# Patient Record
Sex: Male | Born: 1995 | Race: Black or African American | Hispanic: No | Marital: Single | Smoking: Current every day smoker
Health system: Southern US, Community
[De-identification: ages and names within clinical notes are randomized; demographics above are authoritative.]

## PROBLEM LIST (undated history)

## (undated) ENCOUNTER — Emergency Department: Discharge status: Absent without leave | Payer: BC Managed Care – PPO

## (undated) DIAGNOSIS — T7840XA Allergy, unspecified, initial encounter: Secondary | ICD-10-CM

## (undated) DIAGNOSIS — L309 Dermatitis, unspecified: Secondary | ICD-10-CM

## (undated) DIAGNOSIS — N289 Disorder of kidney and ureter, unspecified: Secondary | ICD-10-CM

## (undated) HISTORY — DX: Allergy, unspecified, initial encounter: T78.40XA

## (undated) HISTORY — PX: KIDNEY SURGERY: SHX687

## (undated) HISTORY — DX: Dermatitis, unspecified: L30.9

---

## 2018-02-20 ENCOUNTER — Emergency Department
Admission: EM | Admit: 2018-02-20 | Discharge: 2018-02-20 | Disposition: A | Discharge status: Home / Self Care | Payer: Self-pay | Attending: Emergency Medicine | Admitting: Emergency Medicine

## 2018-02-20 ENCOUNTER — Other Ambulatory Visit: Payer: Self-pay

## 2018-02-20 ENCOUNTER — Emergency Department: Payer: Self-pay

## 2018-02-20 DIAGNOSIS — B354 Tinea corporis: Secondary | ICD-10-CM

## 2018-02-20 DIAGNOSIS — N5089 Other specified disorders of the male genital organs: Secondary | ICD-10-CM

## 2018-02-20 DIAGNOSIS — F17200 Nicotine dependence, unspecified, uncomplicated: Secondary | ICD-10-CM | POA: Insufficient documentation

## 2018-02-20 DIAGNOSIS — N4889 Other specified disorders of penis: Secondary | ICD-10-CM

## 2018-02-20 LAB — CBC WITH DIFFERENTIAL/PLATELET
Basophils Absolute: 0 10*3/uL (ref 0–0.1)
Basophils Relative: 1 %
EOS PCT: 1 %
Eosinophils Absolute: 0.1 10*3/uL (ref 0–0.7)
HCT: 41.4 % (ref 40.0–52.0)
Hemoglobin: 13.8 g/dL (ref 13.0–18.0)
LYMPHS ABS: 2.7 10*3/uL (ref 1.0–3.6)
LYMPHS PCT: 34 %
MCH: 22.4 pg — AB (ref 26.0–34.0)
MCHC: 33.3 g/dL (ref 32.0–36.0)
MCV: 67.3 fL — AB (ref 80.0–100.0)
MONO ABS: 0.7 10*3/uL (ref 0.2–1.0)
Monocytes Relative: 9 %
Neutro Abs: 4.6 10*3/uL (ref 1.4–6.5)
Neutrophils Relative %: 55 %
PLATELETS: 181 10*3/uL (ref 150–440)
RBC: 6.15 MIL/uL — ABNORMAL HIGH (ref 4.40–5.90)
RDW: 15.4 % — AB (ref 11.5–14.5)
WBC: 8.1 10*3/uL (ref 3.8–10.6)

## 2018-02-20 LAB — COMPREHENSIVE METABOLIC PANEL
ALBUMIN: 4.8 g/dL (ref 3.5–5.0)
ALT: 118 U/L — ABNORMAL HIGH (ref 0–44)
AST: 64 U/L — ABNORMAL HIGH (ref 15–41)
Alkaline Phosphatase: 45 U/L (ref 38–126)
Anion gap: 8 (ref 5–15)
BILIRUBIN TOTAL: 0.6 mg/dL (ref 0.3–1.2)
BUN: 14 mg/dL (ref 6–20)
CHLORIDE: 104 mmol/L (ref 98–111)
CO2: 27 mmol/L (ref 22–32)
CREATININE: 1.29 mg/dL — AB (ref 0.61–1.24)
Calcium: 9.8 mg/dL (ref 8.9–10.3)
GFR calc Af Amer: >60 mL/min (ref >60)
GFR calc non Af Amer: >60 mL/min (ref >60)
GLUCOSE: 93 mg/dL (ref 70–99)
POTASSIUM: 4.2 mmol/L (ref 3.5–5.1)
Sodium: 139 mmol/L (ref 135–145)
Total Protein: 8 g/dL (ref 6.5–8.1)

## 2018-02-20 LAB — URINALYSIS, COMPLETE (UACMP) WITH MICROSCOPIC
BACTERIA UA: NONE SEEN
BILIRUBIN URINE: NEGATIVE
Glucose, UA: NEGATIVE mg/dL
HGB URINE DIPSTICK: NEGATIVE
KETONES UR: NEGATIVE mg/dL
LEUKOCYTES UA: NEGATIVE
NITRITE: NEGATIVE
Protein, ur: NEGATIVE mg/dL
Specific Gravity, Urine: 1.018 (ref 1.005–1.030)
Squamous Epithelial / LPF: NONE SEEN (ref 0–5)
pH: 8 (ref 5.0–8.0)

## 2018-02-20 LAB — RAPID HIV SCREEN (HIV 1/2 AB+AG)
HIV 1/2 ANTIBODIES: NONREACTIVE
HIV-1 P24 ANTIGEN - HIV24: NONREACTIVE

## 2018-02-20 MED ORDER — MUPIROCIN CALCIUM 2 % EX CREA: TOPICAL_CREAM | CUTANEOUS | 0 refills | Status: DC

## 2018-02-20 MED ORDER — FLUCONAZOLE 150 MG PO TABS: 150.0000 mg | ORAL_TABLET | ORAL | 0 refills | Status: DC

## 2018-02-20 NOTE — ED Provider Notes (Signed)
[facility] Emergency Department Provider Note  ____________________________________________  Time seen: Approximately 5:12 PM  I have reviewed the triage vital signs and the nursing notes.   HISTORY  Chief Complaint No chief complaint on file.    HPI Duane Simon is a 22 y.o. male who presents the emergency department complaining of swollen area to the left side of his penis as well has a rash to his back.  Patient reports that today when he was urinating he noticed mild erythema and edema to the left side of the penis only.  He denies any pain with finding.  Patient denies any dysuria, polyuria, hematuria.  No penile drainage.  Patient reports that he has had multiple sexual partners and will request STD testing.  Patient also reports that he had sexual relations with a male that was positive for HIV.  Patient is concerned that swelling to the penis may also be related to a spider that he found in his apartment.  He denies any sensation consistent with a bite.    History reviewed. No pertinent past medical history.  There are no active problems to display for this patient.   Past Surgical History:  Procedure Laterality Date  . KIDNEY SURGERY      Prior to Admission medications   Medication Sig Start Date End Date Taking? Authorizing Provider  fluconazole (DIFLUCAN) 150 MG tablet Take 1 tablet (150 mg total) by mouth once a week. 02/20/18   Jemiah Ellenburg, Delorise Royals, PA-C  mupirocin cream (BACTROBAN) 2 % Apply to affected area 3 times daily 02/20/18   Siara Gorder, Delorise Royals, PA-C    Allergies Patient has no known allergies.  History reviewed. No pertinent family history.  Social History Social History   Tobacco Use  . Smoking status: Current Every Day Smoker  Substance Use Topics  . Alcohol use: Yes  . Drug use: Yes    Types: Marijuana     Review of Systems  Constitutional: No fever/chills Eyes: No visual changes.  Cardiovascular: no chest  pain. Respiratory: no cough. No SOB. Gastrointestinal: No abdominal pain.  No nausea, no vomiting.  No diarrhea.  No constipation. Genitourinary: Negative for dysuria. No hematuria.  No penile drainage.  Patient reports erythema to the left side of the penis. Musculoskeletal: Negative for musculoskeletal pain. Skin: Positive for diffuse rash to the posterior back. Neurological: Negative for headaches, focal weakness or numbness. 10-point ROS otherwise negative.  ____________________________________________   PHYSICAL EXAM:  VITAL SIGNS: ED Triage Vitals [02/20/18 1615]  Enc Vitals Group     BP (!) 165/103     Pulse Rate 73     Resp 16     Temp 98.7 F (37.1 C)     Temp Source Oral     SpO2 99 %     Weight 250 lb (113.4 kg)     Height 5\' 8"  (1.727 m)     Head Circumference      Peak Flow      Pain Score 0     Pain Loc      Pain Edu?      Excl. in GC?      Constitutional: Alert and oriented. Well appearing and in no acute distress. Eyes: Conjunctivae are normal. PERRL. EOMI. Head: Atraumatic. ENT:      Ears:       Nose: No congestion/rhinnorhea.      Mouth/Throat: Mucous membranes are moist.  Neck: No stridor.   Hematological/Lymphatic/Immunilogical: No cervical lymphadenopathy.  No inguinal  lymphadenopathy Cardiovascular: Normal rate, regular rhythm. Normal S1 and S2.  Good peripheral circulation. Respiratory: Normal respiratory effort without tachypnea or retractions. Lungs CTAB. Good air entry to the bases with no decreased or absent breath sounds. Gastrointestinal: Bowel sounds 4 quadrants. Soft and nontender to palpation. No guarding or rigidity. No palpable masses. No distention. No CVA tenderness. Genitourinary: Visualization of the penis reveals mild erythema to the lateral aspect, left side of the penile shaft.  Measures less than 1 cm in diameter.  No excoriation.  No chancre.  No vesicle.  Area is nontender to palpation.  Mildly warm to palpation.   Visualization of the testicles, palpation reveals no tenderness or palpable abnormality. Musculoskeletal: Full range of motion to all extremities. No gross deformities appreciated. Neurologic:  Normal speech and language. No gross focal neurologic deficits are appreciated.  Skin:  Skin is warm, dry and intact.  Patient has hyperpigmented macule-like lesions consistent with tinea corporis to the back diffusely.  No erythema or edema concerning for cellulitis.  Rash is not consistent with secondary syphilis. Psychiatric: Mood and affect are normal. Speech and behavior are normal. Patient exhibits appropriate insight and judgement.   ____________________________________________   LABS (all labs ordered are listed, but only abnormal results are displayed)  Labs Reviewed  URINALYSIS, COMPLETE (UACMP) WITH MICROSCOPIC - Abnormal; Notable for the following components:      Result Value   Color, Urine YELLOW (*)    APPearance CLEAR (*)    All other components within normal limits  COMPREHENSIVE METABOLIC PANEL - Abnormal; Notable for the following components:   Creatinine, Ser 1.29 (*)    AST 64 (*)    ALT 118 (*)    All other components within normal limits  CBC WITH DIFFERENTIAL/PLATELET - Abnormal; Notable for the following components:   RBC 6.15 (*)    MCV 67.3 (*)    MCH 22.4 (*)    RDW 15.4 (*)    All other components within normal limits  CHLAMYDIA/NGC RT PCR ([facility] ONLY)  RAPID HIV SCREEN (HIV 1/2 AB+AG)  RPR   ____________________________________________  EKG   ____________________________________________  RADIOLOGY   No results found.  ____________________________________________    PROCEDURES  Procedure(s) performed:    Procedures    Medications - No data to display   ____________________________________________   INITIAL IMPRESSION / ASSESSMENT AND PLAN / ED COURSE  Pertinent labs & imaging results that were available during my care of  the patient were reviewed by me and considered in my medical decision making (see chart for details).  Review of the [place] CSRS was performed in accordance of the [facility] prior to dispensing any controlled drugs.      Patient's diagnosis is consistent with penile swelling, tinea corporis.  Patient presented to the emergency department with complaint of penile swelling.  Visualization reveals mild erythema consistent with either localized irritation or very minimal cellulitis.  Area is nontender to palpation.  No drainage from the penis.  No drainage from the skin lesion.  No chancre or vesicle concerning for syphilis or herpes.  Patient did request STD testing as he has has multiple partners including a partner with HIV.  Rapid HIV was negative.  Syphilis lab is still pending.  No lesions consistent with herpes before skin testing.  Patient will be covered with Bactroban for possible cellulitic changes.  Patient is also given prescription for Diflucan for once weekly for 6 weeks for tinea corporis.  Follow-up with primary care if necessary. Patient is  given ED precautions to return to the ED for any worsening or new symptoms.     ____________________________________________  FINAL CLINICAL IMPRESSION(S) / ED DIAGNOSES  Final diagnoses:  Penile swelling  Tinea corporis      NEW MEDICATIONS STARTED DURING THIS VISIT:  ED Discharge Orders         Ordered    mupirocin cream (BACTROBAN) 2 %     02/20/18 1912    fluconazole (DIFLUCAN) 150 MG tablet  Weekly     02/20/18 1912              This chart was dictated using voice recognition software/Dragon. Despite best efforts to proofread, errors can occur which can change the meaning. Any change was purely unintentional.    Racheal Patches, PA-C 02/20/18 2016    Rockne Menghini, MD 02/21/18 [phone]

## 2018-02-20 NOTE — ED Triage Notes (Signed)
Pt states "I have a couple things of why I'm here but the main thing is my genitals are swollen" states noticed a few hours ago. Thinks he was bitten by bug. States L side is swollen. Denies known injury.   Alert, oriented, ambulatory. No distress noted.

## 2018-02-22 LAB — RPR: RPR: NONREACTIVE

## 2021-07-10 DIAGNOSIS — H52223 Regular astigmatism, bilateral: Secondary | ICD-10-CM | POA: Diagnosis not present

## 2021-08-16 ENCOUNTER — Ambulatory Visit: Payer: BC Managed Care – PPO | Admitting: Nurse Practitioner

## 2021-08-16 ENCOUNTER — Other Ambulatory Visit: Payer: Self-pay

## 2021-08-16 ENCOUNTER — Encounter: Payer: Self-pay | Admitting: Nurse Practitioner

## 2021-08-16 VITALS — BP 124/86 | HR 82 | Temp 98.1°F | Resp 18 | Ht 69.0 in | Wt 246.5 lb

## 2021-08-16 DIAGNOSIS — B354 Tinea corporis: Secondary | ICD-10-CM | POA: Diagnosis not present

## 2021-08-16 DIAGNOSIS — R1011 Right upper quadrant pain: Secondary | ICD-10-CM | POA: Diagnosis not present

## 2021-08-16 DIAGNOSIS — Z130 Encounter for screening for diseases of the blood and blood-forming organs and certain disorders involving the immune mechanism: Secondary | ICD-10-CM

## 2021-08-16 DIAGNOSIS — Z13220 Encounter for screening for lipoid disorders: Secondary | ICD-10-CM | POA: Diagnosis not present

## 2021-08-16 DIAGNOSIS — Z1159 Encounter for screening for other viral diseases: Secondary | ICD-10-CM | POA: Diagnosis not present

## 2021-08-16 DIAGNOSIS — Z23 Encounter for immunization: Secondary | ICD-10-CM

## 2021-08-16 DIAGNOSIS — Z7689 Persons encountering health services in other specified circumstances: Secondary | ICD-10-CM

## 2021-08-16 DIAGNOSIS — R21 Rash and other nonspecific skin eruption: Secondary | ICD-10-CM | POA: Diagnosis not present

## 2021-08-16 DIAGNOSIS — Z131 Encounter for screening for diabetes mellitus: Secondary | ICD-10-CM

## 2021-08-16 MED ORDER — MUPIROCIN CALCIUM 2 % EX CREA: TOPICAL_CREAM | CUTANEOUS | 0 refills | Status: DC

## 2021-08-16 MED ORDER — FLUCONAZOLE 150 MG PO TABS: 150.0000 mg | ORAL_TABLET | ORAL | 0 refills | Status: DC

## 2021-08-16 NOTE — Progress Notes (Signed)
? ?BP 124/86   Pulse 82   Temp 98.1 ?F (36.7 ?C) (Oral)   Resp 18   Ht 5\' 9"  (1.753 m)   Wt 246 lb 8 oz (111.8 kg)   SpO2 97%   BMI 36.40 kg/m?   ? ?Subjective:  ? ? Patient ID: , male    DOB: 01/11/1996, 26 y.o.   MRN: [id number] ? ?HPI: ?Duane Simon is a 26 y.o. male ? ?Chief Complaint  ?Patient presents with  ? Establish Care  ? Rash  ?  Back, neck and arm for 1 year  ? ?Establish care: His last physical was many years ago.   ? ?Rash: He says he has had this rash off and on for a year. He says that he was seen in the emergency department and said that it was a yeast rash. Images taken and placed in media.  He says he was give diflucan and mupirocin cream at the er and that helped.  Refilled prescription and placed referral to dermatology.  ? ?RUQ pain: He says for about a year he has had the pain in his right upper quadrant of his abdomen. He says it comes and goes. He says the pain is a pressure.  Negative murphy sign. He denies any nausea, vomiting, diarrhea, constipation, urinary complaints or fever.  ? ?Relevant past medical, surgical, family and social history reviewed and updated as indicated. Interim medical history since our last visit reviewed. ?Allergies and medications reviewed and updated. ? ?Review of Systems ? ?Constitutional: Negative for fever or weight change.  ?Respiratory: Negative for cough and shortness of breath.   ?Cardiovascular: Negative for chest pain or palpitations.  ?Gastrointestinal: positive for abdominal pain, no bowel changes.  ?Musculoskeletal: Negative for gait problem or joint swelling.  ?Skin: positive for rash.  ?Neurological: Negative for dizziness or headache.  ?No other specific complaints in a complete review of systems (except as listed in HPI above).  ? ?   ?Objective:  ?  ?BP 124/86   Pulse 82   Temp 98.1 ?F (36.7 ?C) (Oral)   Resp 18   Ht 5\' 9"  (1.753 m)   Wt 246 lb 8 oz (111.8 kg)   SpO2 97%   BMI 36.40 kg/m?   ?Wt Readings from Last 3  Encounters:  ?08/16/21 246 lb 8 oz (111.8 kg)  ?02/20/18 250 lb (113.4 kg)  ?  ?Physical Exam ? ?Constitutional: Patient appears well-developed and well-nourished. Obese  No distress.  ?HEENT: head atraumatic, normocephalic, pupils equal and reactive to light,  neck supple ?Cardiovascular: Normal rate, regular rhythm and normal heart sounds.  No murmur heard. No BLE edema. ?Pulmonary/Chest: Effort normal and breath sounds normal. No respiratory distress. ?Abdominal: Soft.  There is no tenderness. ?Skin:dark colored rash noted on chest, arms and back ?Psychiatric: Patient has a normal mood and affect. behavior is normal. Judgment and thought content normal.  ?Results for orders placed or performed in visit on 08/16/21  ?Advanced Written Notification [facility]) Test Refusal  ?Result Value Ref Range  ? RAM1    ? AWN TEST REFUSED 7600   ? ?   ?Assessment & Plan:  ? ?1. Tinea corporis ? ?- Ambulatory referral to Dermatology ?- fluconazole (DIFLUCAN) 150 MG tablet; Take 1 tablet (150 mg total) by mouth once a week.  Dispense: 6 tablet; Refill: 0 ?- mupirocin cream (BACTROBAN) 2 %; Apply to affected area 3 times daily  Dispense: 30 g; Refill: 0 ? ?2. Rash ? ?- Ambulatory referral to  Dermatology ?- fluconazole (DIFLUCAN) 150 MG tablet; Take 1 tablet (150 mg total) by mouth once a week.  Dispense: 6 tablet; Refill: 0 ?- mupirocin cream (BACTROBAN) 2 %; Apply to affected area 3 times daily  Dispense: 30 g; Refill: 0 ? ?3. RUQ abdominal pain ? ?- US ABDOMEN LIMITED RUQ (LIVER/GB); Future ?- CBC with Differential/Platelet ?- COMPLETE METABOLIC PANEL WITH GFR ?- Lipase ?- Amylase ? ?4. Encounter for hepatitis C screening test for low risk patient ? ?- Hepatitis C antibody ? ?5. Screening for cholesterol level ? ?- Lipid panel ? ?6. Screening for diabetes mellitus ? ?- COMPLETE METABOLIC PANEL WITH GFR ? ?7. Screening for deficiency anemia ? ?- CBC with Differential/Platelet ? ?8. Need for Tdap vaccination ? ?- Tdap vaccine greater  than or equal to 7yo IM ? ?9. Encounter to establish care ?-schedule for cpe ? ?Follow up plan: ?Return in about 3 months (around 11/16/2021) for cpe. ? ? ? ? ? ?

## 2021-08-17 LAB — COMPLETE METABOLIC PANEL WITH GFR
AG Ratio: 1.7 (calc) (ref 1.0–2.5)
ALT: 25 U/L (ref 9–46)
AST: 22 U/L (ref 10–40)
Albumin: 4.2 g/dL (ref 3.6–5.1)
Alkaline phosphatase (APISO): 52 U/L (ref 36–130)
BUN/Creatinine Ratio: 10 (calc) (ref 6–22)
BUN: 14 mg/dL (ref 7–25)
CO2: 27 mmol/L (ref 20–32)
Calcium: 9.2 mg/dL (ref 8.6–10.3)
Chloride: 107 mmol/L (ref 98–110)
Creat: 1.35 mg/dL — ABNORMAL HIGH (ref 0.60–1.24)
Globulin: 2.5 g/dL (calc) (ref 1.9–3.7)
Glucose, Bld: 100 mg/dL — ABNORMAL HIGH (ref 65–99)
Potassium: 4.1 mmol/L (ref 3.5–5.3)
Sodium: 140 mmol/L (ref 135–146)
Total Bilirubin: 0.3 mg/dL (ref 0.2–1.2)
Total Protein: 6.7 g/dL (ref 6.1–8.1)
eGFR: 75 mL/min/{1.73_m2} (ref >=60)

## 2021-08-17 LAB — CBC WITH DIFFERENTIAL/PLATELET
Absolute Monocytes: 543 cells/uL (ref 200–950)
Basophils Absolute: 41 cells/uL (ref 0–200)
Basophils Relative: 0.5 %
Eosinophils Absolute: 57 cells/uL (ref 15–500)
Eosinophils Relative: 0.7 %
HCT: 42.2 % (ref 38.5–50.0)
Hemoglobin: 13.4 g/dL (ref 13.2–17.1)
Lymphs Abs: 3240 cells/uL (ref 850–3900)
MCH: 21 pg — ABNORMAL LOW (ref 27.0–33.0)
MCHC: 31.8 g/dL — ABNORMAL LOW (ref 32.0–36.0)
MCV: 66.2 fL — ABNORMAL LOW (ref 80.0–100.0)
MPV: 11.4 fL (ref 7.5–12.5)
Monocytes Relative: 6.7 %
Neutro Abs: 4220 cells/uL (ref 1500–7800)
Neutrophils Relative %: 52.1 %
Platelets: 219 10*3/uL (ref 140–400)
RBC: 6.37 10*6/uL — ABNORMAL HIGH (ref 4.20–5.80)
RDW: 15.8 % — ABNORMAL HIGH (ref 11.0–15.0)
Total Lymphocyte: 40 %
WBC: 8.1 10*3/uL (ref 3.8–10.8)

## 2021-08-17 LAB — HEPATITIS C ANTIBODY
Hepatitis C Ab: NONREACTIVE
SIGNAL TO CUT-OFF: 0.18 (ref <1.00)

## 2021-08-17 LAB — ADVANCED WRITTEN NOTIFICATION (AWN) TEST REFUSAL: AWN TEST REFUSED: 7600

## 2021-08-17 LAB — AMYLASE: Amylase: 62 U/L (ref 21–101)

## 2021-08-17 LAB — LIPASE: Lipase: 52 U/L (ref 7–60)

## 2021-08-22 ENCOUNTER — Other Ambulatory Visit: Payer: Self-pay | Admitting: Nurse Practitioner

## 2021-08-22 DIAGNOSIS — R21 Rash and other nonspecific skin eruption: Secondary | ICD-10-CM

## 2021-08-23 ENCOUNTER — Other Ambulatory Visit: Payer: Self-pay | Admitting: Nurse Practitioner

## 2021-08-23 ENCOUNTER — Telehealth: Payer: Self-pay | Admitting: Nurse Practitioner

## 2021-08-23 DIAGNOSIS — R21 Rash and other nonspecific skin eruption: Secondary | ICD-10-CM

## 2021-08-23 MED ORDER — HYDROCORTISONE 1 % EX CREA: 1.0000 "application " | TOPICAL_CREAM | Freq: Two times a day (BID) | CUTANEOUS | 1 refills | Status: DC

## 2021-08-23 NOTE — Telephone Encounter (Signed)
Requested medication (s) are due for refill today: - ? ?Requested medication (s) are on the active medication list: yes ? ?Last refill:  08/16/21 ? ?Future visit scheduled: yes ? ?Notes to clinic:  pharmacy is requesting ointment ? ? ?Requested Prescriptions  ?Pending Prescriptions Disp Refills  ? mupirocin cream (BACTROBAN) 2 % [Pharmacy Med Name: MUPIROCIN 2% CRM] 30 g 0  ?  Sig: APPLY TO AFFECTED AREA THREE TIMES DAILY  ?  ? Off-Protocol Failed - 08/22/2021  5:53 PM  ?  ?  Failed - Medication not assigned to a protocol, review manually.  ?  ?  Passed - Valid encounter within last 12 months  ?  Recent Outpatient Visits   ? ?      ? 1 week ago Tinea corporis  ? [facility] Berniece Salines, FNP  ? ?  ?  ?Future Appointments   ? ?        ? In 2 months Zane Herald, Rudolpho Sevin, FNP [facility]  ? In 5 months Deirdre Evener, MD [facility]  ? ?  ? ?  ?  ?  ? ? ? ? ?

## 2021-08-23 NOTE — Telephone Encounter (Signed)
Pharmacy asking if there is an alternate medication due to expense. See request.  ?

## 2021-08-23 NOTE — Telephone Encounter (Signed)
Called to inform patient of new script. Mailbox full ?

## 2021-08-23 NOTE — Telephone Encounter (Signed)
Medication Refill - Medication:  ?mupirocin cream (BACTROBAN) 2 %  ? ?Has the patient contacted their pharmacy? Yes.   ?Pharmacy called on behalf of pt stating, the medication is to expensive and requested if PCP could send over an alternate medication ? ?Preferred Pharmacy (with phone number or street name):  ?[id number] - [place] ([place]), [place] - [address]  ?[address] [place] ([place]) [place] [address]  ?Phone:  [phone]  Fax:  [phone] ? ?Has the patient been seen for an appointment in the last year OR does the patient have an upcoming appointment? Yes.   ? ?Agent: Please be advised that RX refills may take up to 3 business days. We ask that you follow-up with your pharmacy. ?

## 2021-08-23 NOTE — Telephone Encounter (Signed)
Please advise 

## 2021-08-24 ENCOUNTER — Ambulatory Visit
Admission: RE | Admit: 2021-08-24 | Discharge: 2021-08-24 | Disposition: A | Discharge status: Home / Self Care | Payer: BC Managed Care – PPO | Source: Ambulatory Visit | Attending: Nurse Practitioner | Admitting: Nurse Practitioner

## 2021-08-24 DIAGNOSIS — R1011 Right upper quadrant pain: Secondary | ICD-10-CM | POA: Insufficient documentation

## 2021-08-25 ENCOUNTER — Other Ambulatory Visit: Payer: Self-pay | Admitting: Nurse Practitioner

## 2021-08-25 DIAGNOSIS — R21 Rash and other nonspecific skin eruption: Secondary | ICD-10-CM

## 2021-08-25 MED ORDER — MUPIROCIN 2 % EX OINT: 1.0000 "application " | TOPICAL_OINTMENT | Freq: Two times a day (BID) | CUTANEOUS | 0 refills | Status: DC

## 2021-10-13 ENCOUNTER — Other Ambulatory Visit: Payer: Self-pay | Admitting: Nurse Practitioner

## 2021-10-13 DIAGNOSIS — R21 Rash and other nonspecific skin eruption: Secondary | ICD-10-CM

## 2021-10-17 ENCOUNTER — Other Ambulatory Visit: Payer: Self-pay

## 2021-10-17 NOTE — Telephone Encounter (Signed)
Requested medications are due for refill today.  unsure  Requested medications are on the active medications list.  yes  Last refill. 08/16/2021 #6 0 refills  Future visit scheduled.   yes  Notes to clinic.  Medication is not assigned to a protocol - please review.    Requested Prescriptions  Pending Prescriptions Disp Refills   fluconazole (DIFLUCAN) 150 MG tablet [Pharmacy Med Name: Fluconazole 150 MG Oral Tablet] 6 tablet 0    Sig: Take 1 tablet by mouth once a week     Off-Protocol Failed - 10/13/2021  5:31 AM      Failed - Medication not assigned to a protocol, review manually.      Passed - Valid encounter within last 12 months    Recent Outpatient Visits           2 months ago Tinea corporis   [facility] Berniece Salines, FNP       Future Appointments             In 1 month Zane Herald, Rudolpho Sevin, FNP [facility]   In 3 months Deirdre Evener, MD [facility]

## 2021-11-16 ENCOUNTER — Ambulatory Visit: Payer: BC Managed Care – PPO | Admitting: Nurse Practitioner

## 2021-11-16 NOTE — Progress Notes (Deleted)
Name: Duane Simon   MRN: [id number]    DOB: 1995-08-28   Date:11/16/2021       Progress Note  Subjective  Chief Complaint  No chief complaint on file.   HPI  Patient presents for annual CPE .   Diet: *** Exercise: *** Sleep: ***  Depression: phq 9 is {gen pos [id number]}    08/16/2021   10:53 AM  Depression screen PHQ 2/9  Decreased Interest 0  Down, Depressed, Hopeless 0  PHQ - 2 Score 0  Altered sleeping 0  Tired, decreased energy 0  Change in appetite 0  Feeling bad or failure about yourself  0  Trouble concentrating 0  Moving slowly or fidgety/restless 0  Suicidal thoughts 0  PHQ-9 Score 0    Hypertension:  BP Readings from Last 3 Encounters:  08/16/21 124/86  02/20/18 139/90    Obesity: Wt Readings from Last 3 Encounters:  08/16/21 246 lb 8 oz (111.8 kg)  02/20/18 250 lb (113.4 kg)   BMI Readings from Last 3 Encounters:  08/16/21 36.40 kg/m  02/20/18 38.01 kg/m     Lipids:  No results found for: "CHOL" No results found for: "HDL" No results found for: "LDLCALC" No results found for: "TRIG" No results found for: "CHOLHDL" No results found for: "LDLDIRECT" Glucose:  Glucose, Bld  Date Value Ref Range Status  08/16/2021 100 (H) 65 - 99 mg/dL Final    Comment:    .            Fasting reference interval . For someone without known diabetes, a glucose value between 100 and 125 mg/dL is consistent with prediabetes and should be confirmed with a follow-up test. .   02/20/2018 93 70 - 99 mg/dL Final    Flowsheet Row Office Visit from 08/16/2021 in [facility]  AUDIT-C Score 0        Single STD testing and prevention (HIV/chl/gon/syphilis): 02/20/2018 Hep C: 08/16/2021  Skin cancer: Discussed monitoring for atypical lesions Colorectal cancer: no concerns, does not qualify Prostate cancer: no concerns, does not qualify No results found for: "PSA"   Lung cancer:   Low Dose CT Chest recommended if Age 28-80 years,  30 pack-year currently smoking OR have quit w/in 15years. Patient does not qualify.   AAA:  The USPSTF recommends one-time screening with ultrasonography in men ages 96 to 33 years who have ever smoked ECG:  none  Vaccines:  HPV: up to at age 80 , ask insurance if age between 16-45  Shingrix: 86-64 yo and ask insurance if covered when patient above 10 yo Pneumonia:  educated and discussed with patient. Flu:  educated and discussed with patient.  Advanced Care Planning: A voluntary discussion about advance care planning including the explanation and discussion of advance directives.  Discussed health care proxy and Living will, and the patient was able to identify a health care proxy as ***.  Patient {DOES_DOES [id number]} have a living will at present time. If patient does have living will, I have requested they bring this to the clinic to be scanned in to their chart.  There are no problems to display for this patient.   Past Surgical History:  Procedure Laterality Date   KIDNEY SURGERY     KIDNEY SURGERY     rupture lidney at 26 years old    No family history on file.  Social History   Socioeconomic History   Marital status: Single    Spouse name: Not on file  Number of children: 1   Years of education: Not on file   Highest education level: Not on file  Occupational History   Not on file  Tobacco Use   Smoking status: Every Day   Smokeless tobacco: Not on file  Vaping Use   Vaping Use: Never used  Substance and Sexual Activity   Alcohol use: Yes   Drug use: Yes    Types: Marijuana   Sexual activity: Not on file  Other Topics Concern   Not on file  Social History Narrative   Not on file   Social Determinants of Health   Financial Resource Strain: Not on file  Food Insecurity: Not on file  Transportation Needs: Not on file  Physical Activity: Not on file  Stress: Not on file  Social Connections: Not on file  Intimate Partner Violence: Not on file      Current Outpatient Medications:    fluconazole (DIFLUCAN) 150 MG tablet, Take 1 tablet (150 mg total) by mouth once a week., Disp: 6 tablet, Rfl: 0   hydrocortisone cream 1 %, Apply 1 application. topically 2 (two) times daily. Do not apply to face, Disp: 15 g, Rfl: 1   mupirocin ointment (BACTROBAN) 2 %, Apply 1 application. topically 2 (two) times daily., Disp: 22 g, Rfl: 0  No Known Allergies   ROS  ***   Objective  There were no vitals filed for this visit.  There is no height or weight on file to calculate BMI.  Physical Exam ***  No results found for this or any previous visit (from the past 2160 hour(s)).   Fall Risk:    08/16/2021   10:52 AM  Fall Risk   Falls in the past year? 0  Number falls in past yr: 0  Injury with Fall? 0  Follow up Falls evaluation completed   ***   Functional Status Survey:   ***   Assessment & Plan  There are no diagnoses linked to this encounter.   -Prostate cancer screening and PSA options (with potential risks and benefits of testing vs not testing) were discussed along with recent recs/guidelines. -USPSTF grade A and B recommendations reviewed with patient; age-appropriate recommendations, preventive care, screening tests, etc discussed and encouraged; healthy living encouraged; see AVS for patient education given to patient -Discussed importance of 150 minutes of physical activity weekly, eat two servings of fish weekly, eat one serving of tree nuts ( cashews, pistachios, pecans, almonds.Marland Kitchen) every other day, eat 6 servings of fruit/vegetables daily and drink plenty of water and avoid sweet beverages.

## 2022-02-08 ENCOUNTER — Ambulatory Visit: Payer: Self-pay | Admitting: Dermatology

## 2022-03-15 DIAGNOSIS — G510 Bell's palsy: Secondary | ICD-10-CM | POA: Diagnosis not present

## 2022-03-16 ENCOUNTER — Ambulatory Visit (INDEPENDENT_AMBULATORY_CARE_PROVIDER_SITE_OTHER): Payer: BC Managed Care – PPO | Admitting: Nurse Practitioner

## 2022-03-16 ENCOUNTER — Encounter: Payer: Self-pay | Admitting: Nurse Practitioner

## 2022-03-16 VITALS — BP 138/72 | HR 93 | Temp 98.7°F | Resp 16 | Ht 69.0 in | Wt 246.1 lb

## 2022-03-16 DIAGNOSIS — G510 Bell's palsy: Secondary | ICD-10-CM | POA: Diagnosis not present

## 2022-03-16 MED ORDER — VALACYCLOVIR HCL 1 G PO TABS: 1000.0000 mg | ORAL_TABLET | Freq: Three times a day (TID) | ORAL | 0 refills | Status: AC

## 2022-03-16 NOTE — Progress Notes (Signed)
BP 138/72 (BP Location: Left Arm, Patient Position: Sitting)   Pulse 93   Temp 98.7 F (37.1 C)   Resp 16   Ht _0  (1.753 m)   Wt 246 lb 1.6 oz (111.6 kg)   SpO2 98%   BMI 36.34 kg/m    Subjective:    Patient ID: Duane Simon, male    DOB: [birth year], 26 y.o.   MRN: [id number]  HPI: Duane Simon is a 26 y.o. male  Chief Complaint  Patient presents with   Pain    Right side facial pain   Bells palsy: Symptoms started yesterday he says he had a headache yesterday and then noticed that his right side facial droop, eye would not close and he could not hold his cigarette in his mouth.  He went to [facility] urgent care was told he had bells palsy and was given a prescription for Prednisone 20 mg tablets, taking 60 mg a day for 7 days.  Patient states he has not started the prednisone yet. Patient reports he is having right sided facial pain, numbness and facial droop.  Other than the above neuro exam was normal.  Will prescribe Valtrex 1000 mg 3 times a day for 7 days.  Discussed with patient about picking up some artificial tears to keep his right eye hydrated.  Discussed with patient taping eye shut when going to sleep to help protect his cornea.  Recommend patient take Tylenol or ibuprofen for the pain.  Patient can also use warm compresses on face to help with pain relief.  Relevant past medical, surgical, family and social history reviewed and updated as indicated. Interim medical history since our last visit reviewed. Allergies and medications reviewed and updated.  Review of Systems  Constitutional: Negative for fever or weight change.  HEENT: Positive for right-sided facial droop, and facial pain Respiratory: Negative for cough and shortness of breath.   Cardiovascular: Negative for chest pain or palpitations.  Gastrointestinal: Negative for abdominal pain, no bowel changes.  Musculoskeletal: Negative for gait problem or joint swelling.  Skin: Negative for rash.  Neurological:  Negative for dizziness or headache.  No other specific complaints in a complete review of systems (except as listed in HPI above).      Objective:    BP 138/72 (BP Location: Left Arm, Patient Position: Sitting)   Pulse 93   Temp 98.7 F (37.1 C)   Resp 16   Ht _1  (1.753 m)   Wt 246 lb 1.6 oz (111.6 kg)   SpO2 98%   BMI 36.34 kg/m   Wt Readings from Last 3 Encounters:  03/16/22 246 lb 1.6 oz (111.6 kg)  08/16/21 246 lb 8 oz (111.8 kg)  02/20/18 250 lb (113.4 kg)    Physical Exam  Constitutional: Patient appears well-developed and well-nourished. Obese  No distress.  HEENT: head atraumatic, normocephalic, pupils equal and reactive to light,  neck supple, throat within normal limits Cardiovascular: Normal rate, regular rhythm and normal heart sounds.  No murmur heard. No BLE edema. Pulmonary/Chest: Effort normal and breath sounds normal. No respiratory distress. Abdominal: Soft.  There is no tenderness. NEURO : Equal grips, no arm drift, no loss of sensation in upper or lower extremities, normal gait, right-sided facial droop Psychiatric: Patient has a normal mood and affect. behavior is normal. Judgment and thought content normal.  Results for orders placed or performed in visit on 08/16/21  CBC with Differential/Platelet  Result Value Ref Range   WBC 8.1 3.8 -  10.8 Thousand/uL   RBC 6.37 (H) 4.20 - 5.80 Million/uL   Hemoglobin 13.4 13.2 - 17.1 g/dL   HCT 42.2 38.5 - 50.0 %   MCV 66.2 (L) 80.0 - 100.0 fL   MCH 21.0 (L) 27.0 - 33.0 pg   MCHC 31.8 (L) 32.0 - 36.0 g/dL   RDW 15.8 (H) 11.0 - 15.0 %   Platelets 219 140 - 400 Thousand/uL   MPV 11.4 7.5 - 12.5 fL   Neutro Abs 4,220 1,500 - 7,800 cells/uL   Lymphs Abs 3,240 850 - 3,900 cells/uL   Absolute Monocytes 543 200 - 950 cells/uL   Eosinophils Absolute 57 15 - 500 cells/uL   Basophils Absolute 41 0 - 200 cells/uL   Neutrophils Relative % 52.1 %   Total Lymphocyte 40.0 %   Monocytes Relative 6.7 %   Eosinophils  Relative 0.7 %   Basophils Relative 0.5 %  COMPLETE METABOLIC PANEL WITH GFR  Result Value Ref Range   Glucose, Bld 100 (H) 65 - 99 mg/dL   BUN 14 7 - 25 mg/dL   Creat 1.35 (H) 0.60 - 1.24 mg/dL   eGFR 75 > OR = 60 mL/min/1.78m   BUN/Creatinine Ratio 10 6 - 22 (calc)   Sodium 140 135 - 146 mmol/L   Potassium 4.1 3.5 - 5.3 mmol/L   Chloride 107 98 - 110 mmol/L   CO2 27 20 - 32 mmol/L   Calcium 9.2 8.6 - 10.3 mg/dL   Total Protein 6.7 6.1 - 8.1 g/dL   Albumin 4.2 3.6 - 5.1 g/dL   Globulin 2.5 1.9 - 3.7 g/dL (calc)   AG Ratio 1.7 1.0 - 2.5 (calc)   Total Bilirubin 0.3 0.2 - 1.2 mg/dL   Alkaline phosphatase (APISO) 52 36 - 130 U/L   AST 22 10 - 40 U/L   ALT 25 9 - 46 U/L  Lipase  Result Value Ref Range   Lipase 52 7 - 60 U/L  Hepatitis C antibody  Result Value Ref Range   Hepatitis C Ab NON-REACTIVE NON-REACTIVE   SIGNAL TO CUT-OFF 0.18 <1.00  Amylase  Result Value Ref Range   Amylase 62 21 - 101 U/L  Advanced Written Notification ([facility] Test Refusal  Result Value Ref Range   RAM1     AWN TEST REFUSED 7600       Assessment & Plan:   Problem List Items Addressed This Visit   None Visit Diagnoses     Overfelt's palsy    -  Primary   Start taking Valtrex, prednisone use artificial tears, tape eye closed when going to sleep can use heat therapy for pain and can take Tylenol or ibuprofen.   Relevant Medications   valACYclovir (VALTREX) 1000 MG tablet        Follow up plan: Return if symptoms worsen or fail to improve.

## 2022-03-29 ENCOUNTER — Telehealth: Payer: Self-pay | Admitting: Nurse Practitioner

## 2022-03-29 NOTE — Telephone Encounter (Signed)
Called patient, mailbox full. I have not received any paperwork they will have to fax again

## 2022-03-29 NOTE — Telephone Encounter (Signed)
Copied from CRM [id number]. Topic: General - Inquiry >> Mar 29, 2022  8:48 AM Clide Dales wrote: Patient states that [name] faxed over paperwork for disability on 03/19/22 and they have not received them back. Patient is wanting an update.

## 2022-04-02 ENCOUNTER — Other Ambulatory Visit: Payer: Self-pay | Admitting: Nurse Practitioner

## 2022-04-02 DIAGNOSIS — G510 Bell's palsy: Secondary | ICD-10-CM

## 2022-04-02 NOTE — Telephone Encounter (Signed)
Pt calling back to follow up stated he spoke with [employer], and forms were re-sent today, and he has physical forms; if they haven't been received, he can drop them off.  Please advise.

## 2022-04-02 NOTE — Telephone Encounter (Signed)
Paperwork located in the folder and given to [employer]

## 2022-04-02 NOTE — Telephone Encounter (Signed)
Do you see this paperwork. Not in our folder

## 2022-04-03 NOTE — Telephone Encounter (Signed)
Paperwork faxed, patient notified.

## 2022-04-23 DIAGNOSIS — G510 Bell's palsy: Secondary | ICD-10-CM | POA: Diagnosis not present

## 2022-04-23 DIAGNOSIS — G43009 Migraine without aura, not intractable, without status migrainosus: Secondary | ICD-10-CM | POA: Diagnosis not present

## 2022-04-23 DIAGNOSIS — Z7689 Persons encountering health services in other specified circumstances: Secondary | ICD-10-CM | POA: Diagnosis not present

## 2022-04-27 NOTE — Telephone Encounter (Signed)
Pt returning missed call from [place]. Stated a message was left in regards to a claim with [employer].  Please advise.

## 2022-04-30 NOTE — Telephone Encounter (Signed)
Spoke to patient and informed him the FMLA paperwork should be filled out by his Neurology since this is who he is seeing now. I informed him they are asking for functional capacity and we do not do them here in office. Patient stated he was suppose to return to work on 12/16 and was trying to extend til 06/05/22. He was informed to take up with Neurology. Paperwork will be faxed to Neuro

## 2022-05-02 ENCOUNTER — Telehealth: Payer: Self-pay | Admitting: Nurse Practitioner

## 2022-05-02 NOTE — Telephone Encounter (Signed)
Copied from CRM [id number]. Topic: General - Other >> May 02, 2022  3:21 PM Clide Dales wrote: Patient states that [employer] is needing more forms filled out to extend his FMLA. Please follow up with patient.

## 2022-05-03 NOTE — Telephone Encounter (Signed)
Left message for patient notifying him that the paperwork was faxed to his Neurologist. That office will be who will have to extend his time off and fill out FMLA

## 2022-05-22 DIAGNOSIS — G43009 Migraine without aura, not intractable, without status migrainosus: Secondary | ICD-10-CM | POA: Diagnosis not present

## 2022-05-22 DIAGNOSIS — G510 Bell's palsy: Secondary | ICD-10-CM | POA: Diagnosis not present

## 2023-03-26 NOTE — Progress Notes (Unsigned)
There were no vitals taken for this visit.   Subjective:    Patient ID: Duane Simon, male    DOB: 04-21-1996, 27 y.o.   MRN: [id number]  HPI: Duane Simon is a 27 y.o. male  No chief complaint on file.   Relevant past medical, surgical, family and social history reviewed and updated as indicated. Interim medical history since our last visit reviewed. Allergies and medications reviewed and updated.  Review of Systems  Ten systems reviewed and is negative except as mentioned in HPI ***      Objective:    There were no vitals taken for this visit.  Wt Readings from Last 3 Encounters:  03/16/22 246 lb 1.6 oz (111.6 kg)  08/16/21 246 lb 8 oz (111.8 kg)  02/20/18 250 lb (113.4 kg)    Physical Exam  Constitutional: Patient appears well-developed and well-nourished. Obese *** No distress.  HEENT: head atraumatic, normocephalic, pupils equal and reactive to light, ears ***, neck supple, throat within normal limits Cardiovascular: Normal rate, regular rhythm and normal heart sounds.  No murmur heard. No BLE edema. Pulmonary/Chest: Effort normal and breath sounds normal. No respiratory distress. Abdominal: Soft.  There is no tenderness. Psychiatric: Patient has a normal mood and affect. behavior is normal. Judgment and thought content normal.  Results for orders placed or performed in visit on 08/16/21  CBC with Differential/Platelet  Result Value Ref Range   WBC 8.1 3.8 - 10.8 Thousand/uL   RBC 6.37 (H) 4.20 - 5.80 Million/uL   Hemoglobin 13.4 13.2 - 17.1 g/dL   HCT 52.8 41.3 - 24.4 %   MCV 66.2 (L) 80.0 - 100.0 fL   MCH 21.0 (L) 27.0 - 33.0 pg   MCHC 31.8 (L) 32.0 - 36.0 g/dL   RDW 01.0 (H) 27.2 - 53.6 %   Platelets 219 140 - 400 Thousand/uL   MPV 11.4 7.5 - 12.5 fL   Neutro Abs 4,220 1,500 - 7,800 cells/uL   Lymphs Abs 3,240 850 - 3,900 cells/uL   Absolute Monocytes 543 200 - 950 cells/uL   Eosinophils Absolute 57 15 - 500 cells/uL   Basophils Absolute 41 0 - 200  cells/uL   Neutrophils Relative % 52.1 %   Total Lymphocyte 40.0 %   Monocytes Relative 6.7 %   Eosinophils Relative 0.7 %   Basophils Relative 0.5 %  COMPLETE METABOLIC PANEL WITH GFR  Result Value Ref Range   Glucose, Bld 100 (H) 65 - 99 mg/dL   BUN 14 7 - 25 mg/dL   Creat 6.44 (H) 0.34 - 1.24 mg/dL   eGFR 75 > OR = 60 VQ/[id number]   BUN/Creatinine Ratio 10 6 - 22 (calc)   Sodium 140 135 - 146 mmol/L   Potassium 4.1 3.5 - 5.3 mmol/L   Chloride 107 98 - 110 mmol/L   CO2 27 20 - 32 mmol/L   Calcium 9.2 8.6 - 10.3 mg/dL   Total Protein 6.7 6.1 - 8.1 g/dL   Albumin 4.2 3.6 - 5.1 g/dL   Globulin 2.5 1.9 - 3.7 g/dL (calc)   AG Ratio 1.7 1.0 - 2.5 (calc)   Total Bilirubin 0.3 0.2 - 1.2 mg/dL   Alkaline phosphatase (APISO) 52 36 - 130 U/L   AST 22 10 - 40 U/L   ALT 25 9 - 46 U/L  Lipase  Result Value Ref Range   Lipase 52 7 - 60 U/L  Hepatitis C antibody  Result Value Ref Range   Hepatitis C Ab NON-REACTIVE  NON-REACTIVE   SIGNAL TO CUT-OFF 0.18 <1.00  Amylase  Result Value Ref Range   Amylase 62 21 - 101 U/L  Advanced Written Notification [facility]) Test Refusal  Result Value Ref Range   RAM1     AWN TEST REFUSED 7600       Assessment & Plan:   Problem List Items Addressed This Visit   None    Follow up plan: No follow-ups on file.

## 2023-03-27 ENCOUNTER — Encounter: Payer: Self-pay | Admitting: Nurse Practitioner

## 2023-03-27 ENCOUNTER — Ambulatory Visit: Payer: BC Managed Care – PPO | Admitting: Nurse Practitioner

## 2023-03-27 ENCOUNTER — Ambulatory Visit
Admission: RE | Admit: 2023-03-27 | Discharge: 2023-03-27 | Disposition: A | Discharge status: Home / Self Care | Payer: BC Managed Care – PPO | Source: Ambulatory Visit | Attending: Nurse Practitioner | Admitting: Nurse Practitioner

## 2023-03-27 ENCOUNTER — Ambulatory Visit
Admission: RE | Admit: 2023-03-27 | Discharge: 2023-03-27 | Disposition: A | Discharge status: Home / Self Care | Payer: BC Managed Care – PPO | Attending: Nurse Practitioner | Admitting: Nurse Practitioner

## 2023-03-27 ENCOUNTER — Other Ambulatory Visit: Payer: Self-pay

## 2023-03-27 VITALS — BP 122/80 | HR 99 | Temp 98.8°F | Resp 18 | Ht 69.0 in | Wt 244.1 lb

## 2023-03-27 DIAGNOSIS — M5441 Lumbago with sciatica, right side: Secondary | ICD-10-CM | POA: Insufficient documentation

## 2023-03-27 DIAGNOSIS — J40 Bronchitis, not specified as acute or chronic: Secondary | ICD-10-CM

## 2023-03-27 DIAGNOSIS — G8929 Other chronic pain: Secondary | ICD-10-CM

## 2023-03-27 DIAGNOSIS — M545 Low back pain, unspecified: Secondary | ICD-10-CM | POA: Diagnosis not present

## 2023-03-27 DIAGNOSIS — R051 Acute cough: Secondary | ICD-10-CM | POA: Diagnosis not present

## 2023-03-27 MED ORDER — METHOCARBAMOL 500 MG PO TABS: 500.0000 mg | ORAL_TABLET | Freq: Two times a day (BID) | ORAL | 0 refills | Status: DC | PRN

## 2023-03-27 MED ORDER — AZITHROMYCIN 250 MG PO TABS: ORAL_TABLET | ORAL | 0 refills | Status: AC

## 2023-03-27 MED ORDER — NAPROXEN 500 MG PO TABS: 500.0000 mg | ORAL_TABLET | Freq: Two times a day (BID) | ORAL | 0 refills | Status: DC

## 2023-03-27 MED ORDER — ALBUTEROL SULFATE HFA 108 (90 BASE) MCG/ACT IN AERS: 2.0000 | INHALATION_SPRAY | Freq: Four times a day (QID) | RESPIRATORY_TRACT | 0 refills | Status: AC | PRN

## 2023-03-27 MED ORDER — METHYLPREDNISOLONE 4 MG PO TBPK: ORAL_TABLET | ORAL | 0 refills | Status: AC

## 2023-03-29 ENCOUNTER — Encounter: Payer: Self-pay | Admitting: Nurse Practitioner

## 2023-03-29 ENCOUNTER — Telehealth: Payer: Self-pay | Admitting: Nurse Practitioner

## 2023-03-29 LAB — NOVEL CORONAVIRUS, NAA: SARS-CoV-2, NAA: NOT DETECTED

## 2023-03-29 LAB — SPECIMEN STATUS REPORT

## 2023-03-29 NOTE — Telephone Encounter (Signed)
Patient called and states he has an office visit with Della Goo on 03/27/2023 and states he was written out of work until today, 03/29/2023 but patient states he went back to work today but that he is still not feeling well and wants the out of work note extended until atleast Monday or Tuesday of next week. Please advise and follow back up with the patient @ # [phone].

## 2023-03-29 NOTE — Telephone Encounter (Signed)
Letter faxed to [phone] per patient

## 2023-03-29 NOTE — Telephone Encounter (Signed)
Is this ok?

## 2023-03-30 IMAGING — US US ABDOMEN LIMITED
1 series · 14 of 25 positions shown · non-contrast
Comparison: None.

CLINICAL DATA: Right upper quadrant pain for a year.

EXAM:
ULTRASOUND ABDOMEN LIMITED RIGHT UPPER QUADRANT

[Series 1: us abdomen limited ruq (liver/gb) · 14 of 40 slices shown]
[im 1/40]
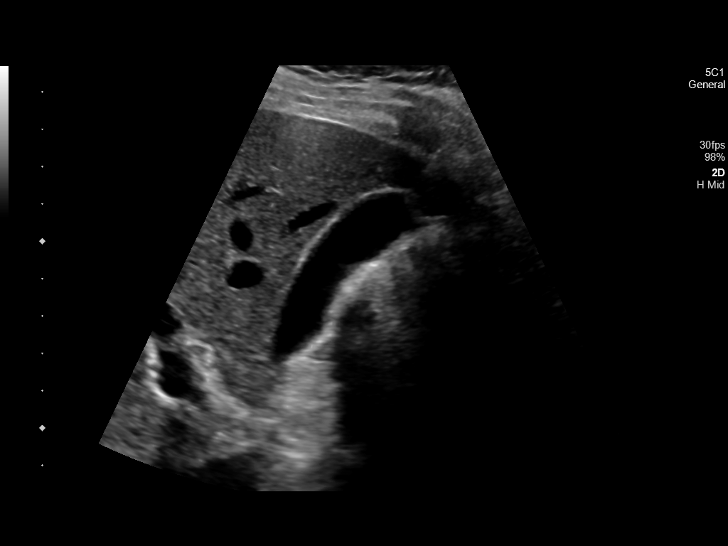
[im 4/40]
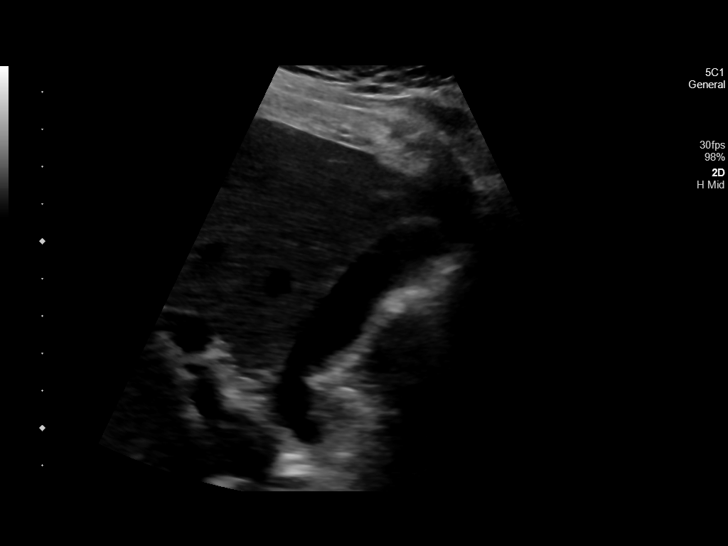
[im 7/40]
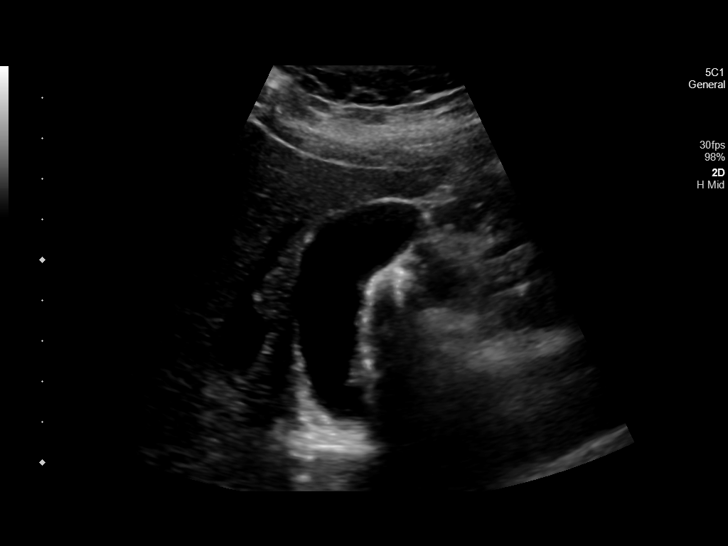
[im 10/40]
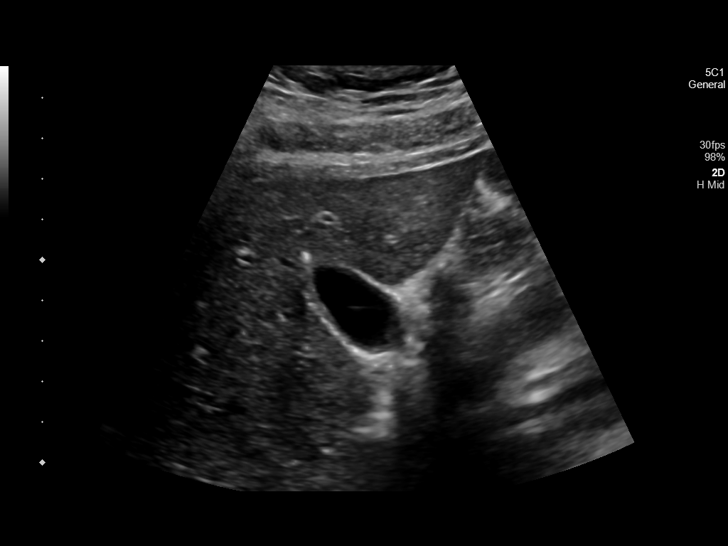
[im 14/40]
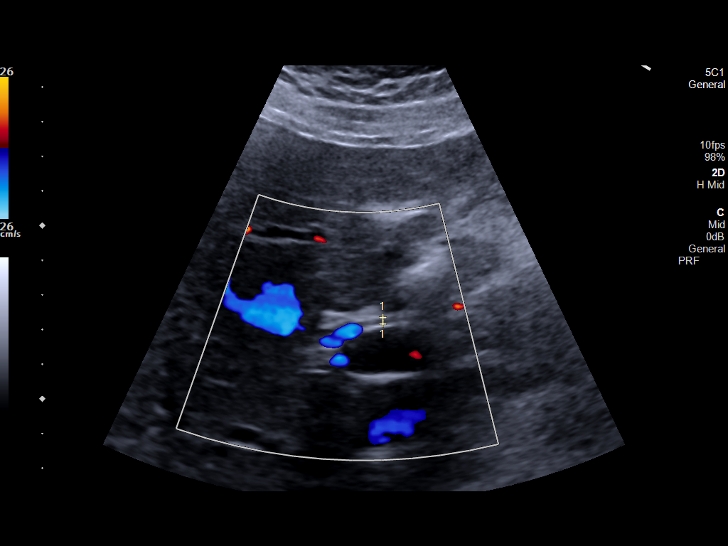
[im 15/40]
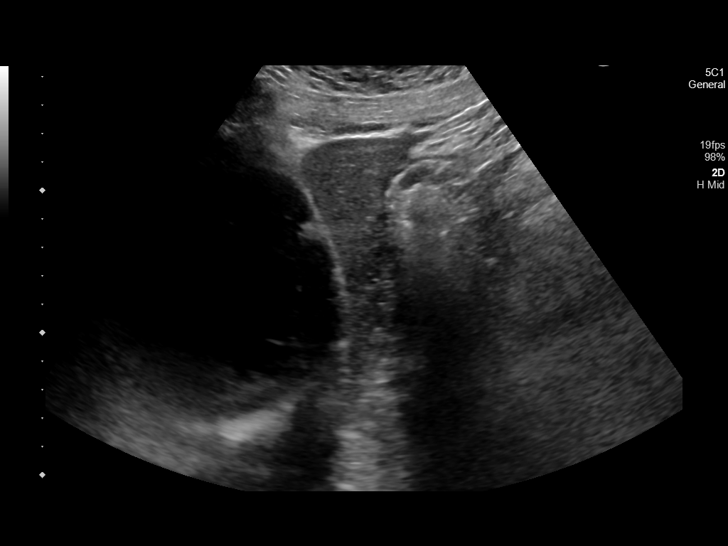
[im 18/40]
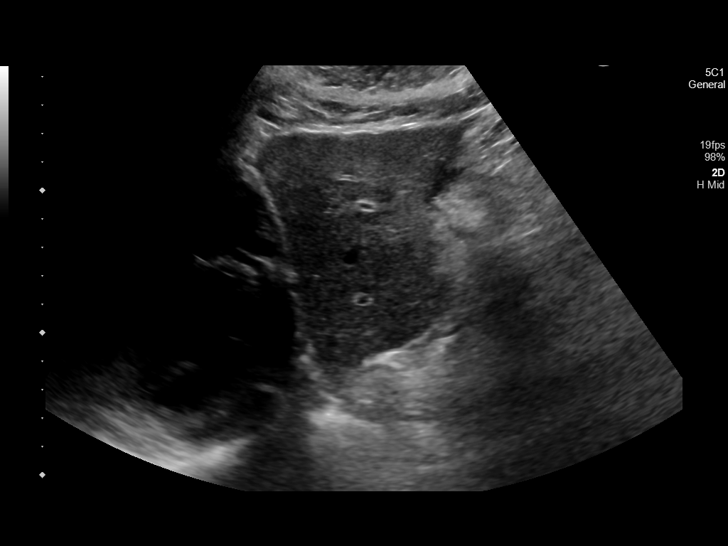
[im 22/40]
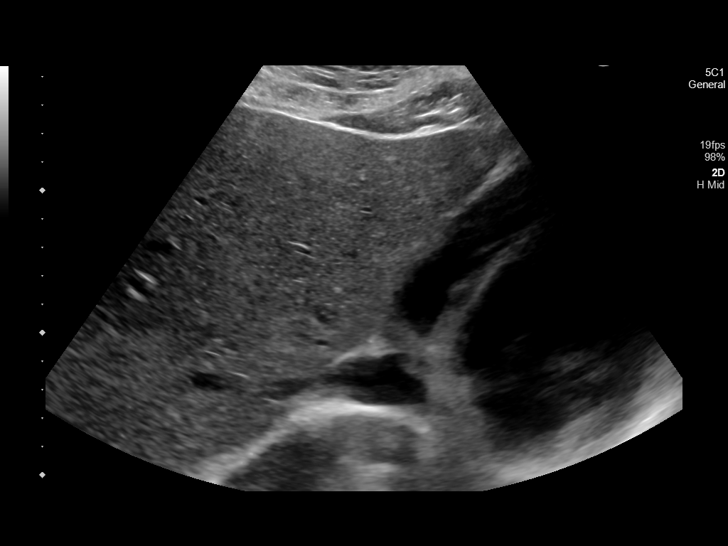
[im 25/40]
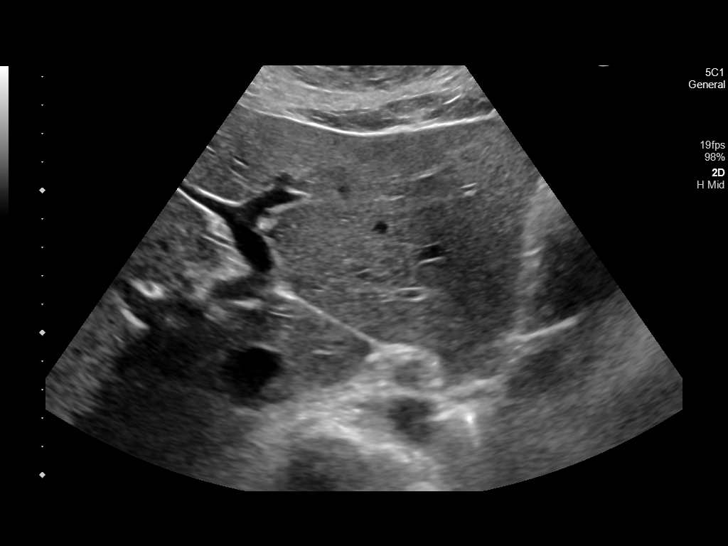
[im 27/40]
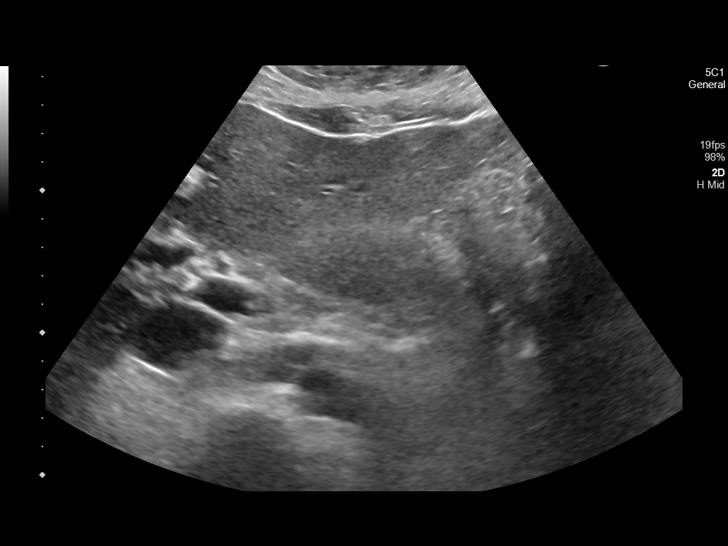
[im 30/40]
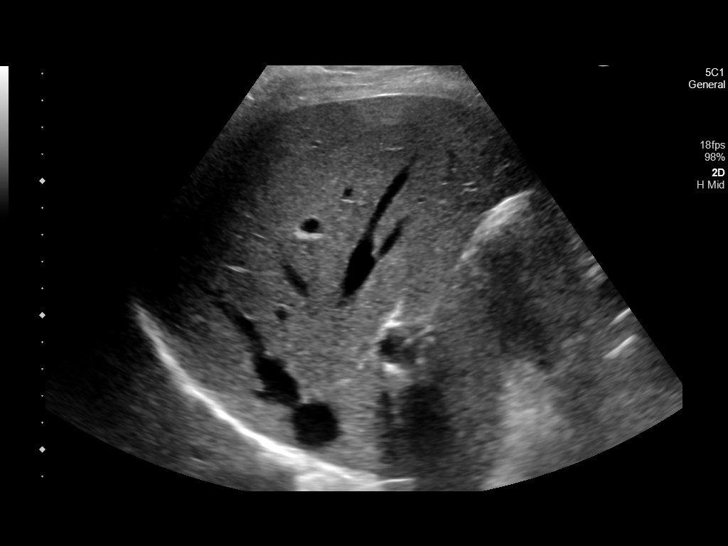
[im 33/40]
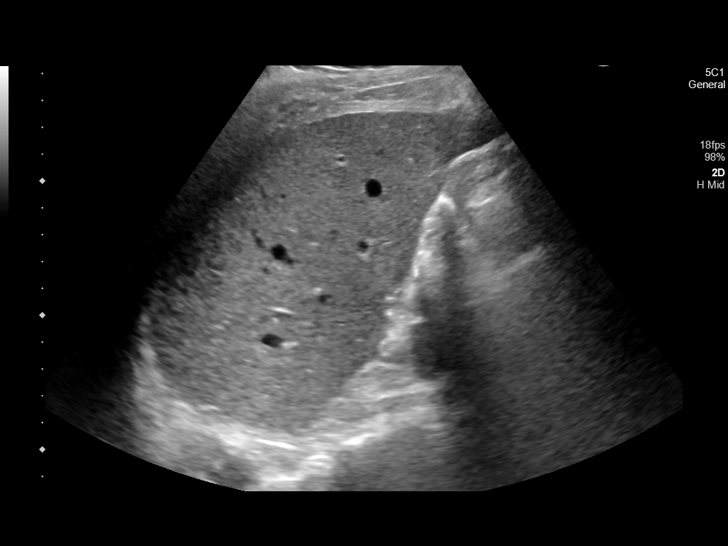
[im 36/40]
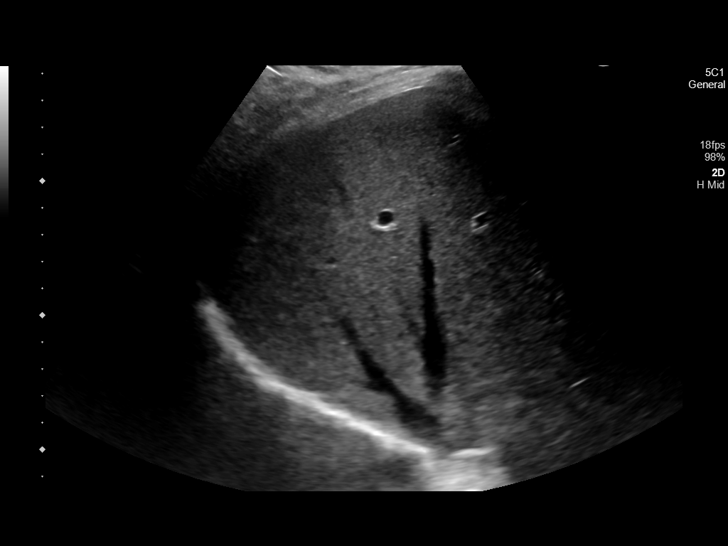
[im 40/40]
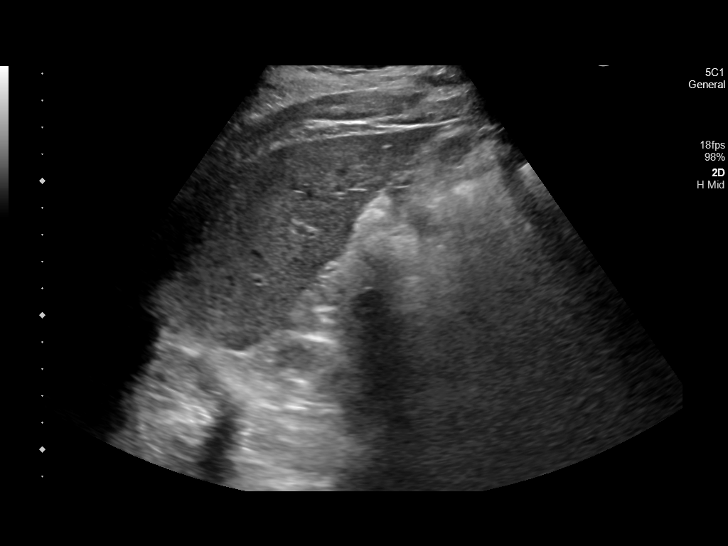

[14 of 25 positions shown; findings below may reference images not displayed]

FINDINGS: Gallbladder:

No gallstones or wall thickening visualized. No sonographic Murphy
sign noted by sonographer.

Common bile duct:

Diameter: 2 mm

Liver:

Diffuse increased echogenicity in the liver. No focal mass. Portal
vein is patent on color Doppler imaging with normal direction of
blood flow towards the liver.

Other: None.
IMPRESSION: 1. Diffuse increased echogenicity throughout the liver is
nonspecific but often due to hepatic steatosis.
2. The gallbladder and common bile duct are normal.

## 2023-04-01 NOTE — Telephone Encounter (Signed)
Patient called sttd his employer never recvd the work note so he is asking that it be refaxed to [phone] and sent to his email [email or web address]. Please f/u with patient

## 2023-04-01 NOTE — Telephone Encounter (Signed)
Cannot email but will fax again

## 2023-04-01 NOTE — Telephone Encounter (Signed)
Copy mailed. Fax went through on our end

## 2023-04-30 ENCOUNTER — Other Ambulatory Visit: Payer: Self-pay | Admitting: Nurse Practitioner

## 2023-04-30 DIAGNOSIS — G8929 Other chronic pain: Secondary | ICD-10-CM

## 2023-05-01 MED ORDER — NAPROXEN 500 MG PO TABS: 500.0000 mg | ORAL_TABLET | Freq: Two times a day (BID) | ORAL | 0 refills | Status: AC

## 2023-05-01 MED ORDER — METHOCARBAMOL 500 MG PO TABS
500.0000 mg | ORAL_TABLET | Freq: Two times a day (BID) | ORAL | 0 refills | Status: AC | PRN
Start: 2023-05-01 — End: ?

## 2024-05-15 ENCOUNTER — Emergency Department: Payer: Self-pay

## 2024-05-15 ENCOUNTER — Other Ambulatory Visit: Payer: Self-pay

## 2024-05-15 ENCOUNTER — Encounter: Payer: Self-pay | Admitting: Emergency Medicine

## 2024-05-15 DIAGNOSIS — F17200 Nicotine dependence, unspecified, uncomplicated: Secondary | ICD-10-CM | POA: Insufficient documentation

## 2024-05-15 DIAGNOSIS — J101 Influenza due to other identified influenza virus with other respiratory manifestations: Secondary | ICD-10-CM | POA: Insufficient documentation

## 2024-05-15 HISTORY — DX: Disorder of kidney and ureter, unspecified: N28.9

## 2024-05-15 LAB — BASIC METABOLIC PANEL WITH GFR
Anion gap: 13 (ref 5–15)
BUN: 15 mg/dL (ref 6–20)
CO2: 22 mmol/L (ref 22–32)
Calcium: 9 mg/dL (ref 8.9–10.3)
Chloride: 101 mmol/L (ref 98–111)
Creatinine, Ser: 1.38 mg/dL — ABNORMAL HIGH (ref 0.61–1.24)
GFR, Estimated: >60 mL/min
Glucose, Bld: 110 mg/dL — ABNORMAL HIGH (ref 70–99)
Potassium: 3.4 mmol/L — ABNORMAL LOW (ref 3.5–5.1)
Sodium: 136 mmol/L (ref 135–145)

## 2024-05-15 LAB — CBC
HCT: 37.4 % — ABNORMAL LOW (ref 39.0–52.0)
Hemoglobin: 11.9 g/dL — ABNORMAL LOW (ref 13.0–17.0)
MCH: 21.2 pg — ABNORMAL LOW (ref 26.0–34.0)
MCHC: 31.8 g/dL (ref 30.0–36.0)
MCV: 66.5 fL — ABNORMAL LOW (ref 80.0–100.0)
Platelets: 166 K/uL (ref 150–400)
RBC: 5.62 MIL/uL (ref 4.22–5.81)
RDW: 14.4 % (ref 11.5–15.5)
WBC: 6.1 K/uL (ref 4.0–10.5)
nRBC: 0 % (ref 0.0–0.2)

## 2024-05-15 LAB — RESP PANEL BY RT-PCR (RSV, FLU A&B, COVID)  RVPGX2
Influenza A by PCR: POSITIVE — AB
Influenza B by PCR: NEGATIVE
Resp Syncytial Virus by PCR: NEGATIVE
SARS Coronavirus 2 by RT PCR: NEGATIVE

## 2024-05-15 LAB — TROPONIN T, HIGH SENSITIVITY: Troponin T High Sensitivity: <15 ng/L (ref 0–19)

## 2024-05-15 NOTE — ED Triage Notes (Addendum)
 Pt arrived via POV with reports of poor PO intake, c/o chills, chest heaviness, reports shortness of breath, cough, c/o fatigue as well x 2 days. Reports fevers at home.

## 2024-05-16 ENCOUNTER — Emergency Department
Admission: EM | Admit: 2024-05-16 | Discharge: 2024-05-16 | Disposition: A | Discharge status: Home / Self Care | Payer: Self-pay | Attending: Emergency Medicine | Admitting: Emergency Medicine

## 2024-05-16 DIAGNOSIS — J101 Influenza due to other identified influenza virus with other respiratory manifestations: Secondary | ICD-10-CM

## 2024-05-16 DIAGNOSIS — R0789 Other chest pain: Secondary | ICD-10-CM

## 2024-05-16 MED ORDER — HYDROCOD POLI-CHLORPHE POLI ER 10-8 MG/5ML PO SUER: 5.0000 mL | Freq: Two times a day (BID) | ORAL | 0 refills | Status: AC | PRN

## 2024-05-16 MED ORDER — ONDANSETRON 4 MG PO TBDP: ORAL_TABLET | ORAL | 0 refills | Status: AC

## 2024-05-16 NOTE — ED Provider Notes (Signed)
 "  [facility] Provider Note    Event Date/Time   First MD Initiated Contact with Patient 05/16/24 [phone]     (approximate)   History   Chest Pain   HPI Duane Simon is a 28 y.o. male with no medical conditions who presents for evaluation of 3 days of chest discomfort, cough, body aches, no energy, nasal congestion, sore throat.  Shortness of breath associated with cough.  Says he just feels really bad.  Able to continue smoking.     Physical Exam   Triage Vital Signs: ED Triage Vitals  Encounter Vitals Group     BP 05/15/24 2215 (!) 146/98     Girls Systolic BP Percentile --      Girls Diastolic BP Percentile --      Boys Systolic BP Percentile --      Boys Diastolic BP Percentile --      Pulse Rate 05/15/24 2215 60     Resp 05/15/24 2215 20     Temp 05/15/24 2215 98.9 F (37.2 C)     Temp Source 05/15/24 2215 Oral     SpO2 05/15/24 2215 100 %     Weight 05/15/24 2215 97.5 kg (215 lb)     Height 05/15/24 2215 1.753 m (5' 9)     Head Circumference --      Peak Flow --      Pain Score 05/15/24 2222 0     Pain Loc --      Pain Education --      Exclude from Growth Chart --     Most recent vital signs: Vitals:   05/15/24 2215 05/16/24 0112  BP: (!) 146/98   Pulse: 60   Resp: 20   Temp: 98.9 F (37.2 C)   SpO2: 100% 100%    General: Awake, appears uncomfortable from viral illness but nontoxic.  Pleasant and conversant. CV:  Good peripheral perfusion.  Regular rate and rhythm, normal heart sounds. Resp:  Normal effort. Speaking easily and comfortably, no accessory muscle usage nor intercostal retractions.  Lungs are clear to auscultation bilaterally.  Occasional cough. Abd:  No distention.    ED Results / Procedures / Treatments   Labs (all labs ordered are listed, but only abnormal results are displayed) Labs Reviewed  RESP PANEL BY RT-PCR (RSV, FLU A&B, COVID)  RVPGX2 - Abnormal; Notable for the following components:      Result  Value   Influenza A by PCR POSITIVE (*)    All other components within normal limits  BASIC METABOLIC PANEL WITH GFR - Abnormal; Notable for the following components:   Potassium 3.4 (*)    Glucose, Bld 110 (*)    Creatinine, Ser 1.38 (*)    All other components within normal limits  CBC - Abnormal; Notable for the following components:   Hemoglobin 11.9 (*)    HCT 37.4 (*)    MCV 66.5 (*)    MCH 21.2 (*)    All other components within normal limits  TROPONIN T, HIGH SENSITIVITY     EKG  ED ECG REPORT I, Darleene Dome, the attending physician, personally viewed and interpreted this ECG.  Date: 05/15/2024 EKG Time: 22: 32 Rate: 64 Rhythm: normal sinus rhythm QRS Axis: normal Intervals: normal ST/T Wave abnormalities: normal Narrative Interpretation: no evidence of acute ischemia    RADIOLOGY I independently viewed and interpreted the patient's chest x-ray and I see no evidence of any pneumonia or pneumothorax.  I also  read the radiologist's report, which confirmed no acute findings.   PROCEDURES:  Critical Care performed: No  Procedures    IMPRESSION / MDM / ASSESSMENT AND PLAN / ED COURSE  I reviewed the triage vital signs and the nursing notes.                              Differential diagnosis includes, but is not limited to, influenza or other viral illness, community-acquired pneumonia, pneumothorax, less likely ACS  Patient's presentation is most consistent with acute presentation with potential threat to life or bodily function.  Labs/studies ordered: EKG, chest x-ray, viral panel, BMP, high sensitive troponin, CBC  Interventions/Medications given:  Medications - No data to display  (Note:  hospital course my include additional interventions and/or labs/studies not listed above.)   Labs reassuring including stable chronic kidney disease, negative high-sensitivity troponin, normal EKG, clear chest x-ray.  Positive for influenza.  I had my usual flu  discussion with the patient and provided prescriptions as listed below.  I gave my usual return precautions.  Very low risk for ACS based on HEART score, no indication for repeat troponin and the patient has been having symptoms for days.  Not a candidate for Tamiflu         FINAL CLINICAL IMPRESSION(S) / ED DIAGNOSES   Final diagnoses:  Influenza A  Atypical chest pain     Rx / DC Orders   ED Discharge Orders          Ordered    chlorpheniramine-HYDROcodone (TUSSIONEX) 10-8 MG/5ML  Every 12 hours PRN        05/16/24 0122    ondansetron  (ZOFRAN -ODT) 4 MG disintegrating tablet        05/16/24 0122             Note:  This document was prepared using Dragon voice recognition software and may include unintentional dictation errors.   Gordan Huxley, MD 05/16/24 [phone]  "

## 2024-05-16 NOTE — Discharge Instructions (Signed)
 You were diagnosed with the flu (influenza).  You will feel ill for as much as a few weeks.  Please take any prescribed medications as instructed, and you may use over-the-counter Tylenol and/or ibuprofen as needed according to label instructions (unless you have an allergy to either or have been told by your doctor not to take them).  Please make sure to drink plenty of fluids and refer to the included information about rehydration.  Follow up with your physician as instructed above, and return to the Emergency Department (ED) if you are unable to tolerate fluids due to vomiting, have worsening trouble breathing, become extremely tired or difficult to awaken, or if you develop any other symptoms that concern you.
